# Patient Record
Sex: Female | Born: 1995 | Race: White | Hispanic: No | Marital: Single | Smoking: Current every day smoker
Health system: Southern US, Community
[De-identification: ages and names within clinical notes are randomized; demographics above are authoritative.]

## PROBLEM LIST (undated history)

## (undated) DIAGNOSIS — IMO0002 Reserved for concepts with insufficient information to code with codable children: Secondary | ICD-10-CM

---

## 2016-12-25 ENCOUNTER — Encounter (HOSPITAL_COMMUNITY): Payer: Self-pay | Admitting: Emergency Medicine

## 2016-12-25 DIAGNOSIS — K920 Hematemesis: Secondary | ICD-10-CM | POA: Insufficient documentation

## 2016-12-25 DIAGNOSIS — K279 Peptic ulcer, site unspecified, unspecified as acute or chronic, without hemorrhage or perforation: Secondary | ICD-10-CM | POA: Insufficient documentation

## 2016-12-25 DIAGNOSIS — F17200 Nicotine dependence, unspecified, uncomplicated: Secondary | ICD-10-CM | POA: Diagnosis not present

## 2016-12-25 DIAGNOSIS — R1013 Epigastric pain: Secondary | ICD-10-CM | POA: Diagnosis present

## 2016-12-25 DIAGNOSIS — K921 Melena: Secondary | ICD-10-CM | POA: Insufficient documentation

## 2016-12-25 LAB — CBC
HEMATOCRIT: 41.7 % (ref 36.0–46.0)
Hemoglobin: 14.2 g/dL (ref 12.0–15.0)
MCH: 29.4 pg (ref 26.0–34.0)
MCHC: 34.1 g/dL (ref 30.0–36.0)
MCV: 86.3 fL (ref 78.0–100.0)
Platelets: 331 10*3/uL (ref 150–400)
RBC: 4.83 MIL/uL (ref 3.87–5.11)
RDW: 14.6 % (ref 11.5–15.5)
WBC: 10.2 10*3/uL (ref 4.0–10.5)

## 2016-12-25 LAB — COMPREHENSIVE METABOLIC PANEL
ALBUMIN: 4.1 g/dL (ref 3.5–5.0)
ALK PHOS: 62 U/L (ref 38–126)
ALT: 11 U/L — AB (ref 14–54)
ANION GAP: 9 (ref 5–15)
AST: 16 U/L (ref 15–41)
BUN: 11 mg/dL (ref 6–20)
CALCIUM: 9.6 mg/dL (ref 8.9–10.3)
CHLORIDE: 104 mmol/L (ref 101–111)
CO2: 27 mmol/L (ref 22–32)
Creatinine, Ser: 0.9 mg/dL (ref 0.44–1.00)
GFR calc Af Amer: >60 mL/min (ref >60)
GFR calc non Af Amer: >60 mL/min (ref >60)
GLUCOSE: 73 mg/dL (ref 65–99)
Potassium: 3.6 mmol/L (ref 3.5–5.1)
SODIUM: 140 mmol/L (ref 135–145)
Total Bilirubin: 0.5 mg/dL (ref 0.3–1.2)
Total Protein: 7 g/dL (ref 6.5–8.1)

## 2016-12-25 LAB — TYPE AND SCREEN
ABO/RH(D): B POS
Antibody Screen: NEGATIVE

## 2016-12-25 LAB — ABO/RH: ABO/RH(D): B POS

## 2016-12-25 NOTE — ED Triage Notes (Signed)
Pt. Stated, I've been having dark stool  For about 3 months and vomiting up blood today, and Its been making feel weak. This has been going on for 3 months.

## 2016-12-25 NOTE — ED Triage Notes (Signed)
Pt. Stated, [facility] and [facility] sent [facility] here.  Im really so tired.

## 2016-12-26 ENCOUNTER — Emergency Department (HOSPITAL_COMMUNITY)
Admission: EM | Admit: 2016-12-26 | Discharge: 2016-12-26 | Disposition: A | Discharge status: Home / Self Care | Payer: Medicaid Other | Attending: Emergency Medicine | Admitting: Emergency Medicine

## 2016-12-26 DIAGNOSIS — R195 Other fecal abnormalities: Secondary | ICD-10-CM

## 2016-12-26 DIAGNOSIS — R198 Other specified symptoms and signs involving the digestive system and abdomen: Secondary | ICD-10-CM

## 2016-12-26 DIAGNOSIS — K920 Hematemesis: Secondary | ICD-10-CM

## 2016-12-26 HISTORY — DX: Reserved for concepts with insufficient information to code with codable children: IMO0002

## 2016-12-26 LAB — POC OCCULT BLOOD, ED: Fecal Occult Bld: NEGATIVE

## 2016-12-26 MED ORDER — PANTOPRAZOLE SODIUM 40 MG PO TBEC
40.0000 mg | DELAYED_RELEASE_TABLET | Freq: Once | ORAL | Status: AC
  Administered 2016-12-26: 40 mg via ORAL
  Filled 2016-12-26: qty 1

## 2016-12-26 MED ORDER — PANTOPRAZOLE SODIUM 20 MG PO TBEC: 20.0000 mg | DELAYED_RELEASE_TABLET | Freq: Every day | ORAL | 0 refills | Status: DC

## 2016-12-26 MED ORDER — SUCRALFATE 1 G PO TABS: 1.0000 g | ORAL_TABLET | Freq: Three times a day (TID) | ORAL | 0 refills | Status: DC

## 2016-12-26 MED ORDER — SUCRALFATE 1 G PO TABS
1.0000 g | ORAL_TABLET | Freq: Once | ORAL | Status: AC
  Administered 2016-12-26: 1 g via ORAL
  Filled 2016-12-26: qty 1

## 2016-12-26 MED ORDER — ONDANSETRON 4 MG PO TBDP: ORAL_TABLET | ORAL | 0 refills | Status: DC

## 2016-12-26 MED ORDER — SODIUM CHLORIDE 0.9 % IV BOLUS (SEPSIS): 1000.0000 mL | Freq: Once | INTRAVENOUS | Status: DC

## 2016-12-26 NOTE — ED Notes (Signed)
EDP and this RN at bedside, pt has refused IV and fluids, states her BP normally run low. Pt did agree to rectal exam to obtain hemoccult.

## 2016-12-26 NOTE — ED Provider Notes (Signed)
[facility] Provider Note   CSN: [id number] Arrival date & time: 12/25/16  1802     History   Chief Complaint Chief Complaint  Patient presents with  . Abdominal Pain  . Emesis    "vomiting blood"  . Rectal Bleeding  . Weakness    HPI Shelby Robles is a 21 y.o. female with a hx of peptic ulcer presents to the Emergency Department complaining of intermittent, but progressively worsening epigastric abdominal pain described as burning in nature with associated dark stools and intermittent emesis with bloody streaks onset 3 months ago. Patient is not taking any medications for peptic ulcer disease. She has not sought medical care prior to today. Pt reports intermittent EtOH usage, but denies drug usage.  Pt also with daily ibuprofen usage. She reports taking 800 mg ibuprofen 3 times a day not always with food.  Patient reports severe abdominal pain when her stomach is empty as improved after eating.  Patient also reports improvement in shoulder pain after taking her ibuprofen.  She denies fever chills, diarrhea, recent travel, chest pain, shortness of breath, palpitations, bleeding from any other site.    The history is provided by the patient and medical records. No language interpreter was used.    Past Medical History:  Diagnosis Date  . Ulcer (HCC)     There are no active problems to display for this patient.   History reviewed. No pertinent surgical history.  OB History    No data available       Home Medications    Prior to Admission medications   Medication Sig Start Date End Date Taking? Authorizing Provider  ondansetron (ZOFRAN ODT) 4 MG disintegrating tablet 4mg  ODT q4 hours prn nausea/vomit 12/26/16   Idelia Caudell, PA-C  pantoprazole (PROTONIX) 20 MG tablet Take 1 tablet (20 mg total) by mouth daily. 12/26/16   Kimila Papaleo, PA-C  sucralfate (CARAFATE) 1 g tablet Take 1 tablet (1 g total) by mouth 4 (four) times daily -  with meals and at  bedtime. 12/26/16   Dahlia Client Camran Keady, PA-C    Family History No family history on file.  Social History Social History  Substance Use Topics  . Smoking status: Current Every Day Smoker  . Smokeless tobacco: Current User  . Alcohol use No     Allergies   Patient has no allergy information on record.   Review of Systems Review of Systems  Constitutional: Negative for appetite change and fever.  HENT: Negative for nosebleeds.   Respiratory: Negative for chest tightness.   Cardiovascular: Negative for palpitations.  Gastrointestinal: Positive for abdominal pain, blood in stool, hematochezia and vomiting.  Genitourinary: Negative for dysuria and hematuria.  Skin: Negative for rash and wound.  Hematological: Does not bruise/bleed easily.  All other systems reviewed and are negative.    Physical Exam Updated Vital Signs BP 93/62   Pulse 95   Temp 98.4 F (36.9 C) (Oral)   Resp 16   Ht 5\' 2"  (1.575 m)   Wt 72.6 kg   LMP 12/04/2016   SpO2 100%   BMI 29.26 kg/m   Physical Exam  Constitutional: She appears well-developed and well-nourished. No distress.  Awake, alert, nontoxic appearance  HENT:  Head: Normocephalic and atraumatic.  Mouth/Throat: Oropharynx is clear and moist. No oropharyngeal exudate.  Eyes: Conjunctivae are normal. No scleral icterus.  Neck: Normal range of motion. Neck supple.  Cardiovascular: Normal rate, regular rhythm and intact distal pulses.   Pulmonary/Chest: Effort normal and breath  sounds normal. No respiratory distress. She has no wheezes.  Equal chest expansion  Abdominal: Soft. Bowel sounds are normal. She exhibits no mass. There is no tenderness. There is no rebound and no guarding.  Genitourinary: Rectal exam shows no external hemorrhoid, no internal hemorrhoid, no fissure, no mass, no tenderness, anal tone normal and guaiac negative stool. Pelvic exam was performed with patient in the knee-chest position.  Genitourinary Comments:  Hard stool in the rectal vault  No BRBPR or tarry stool on DRE  Musculoskeletal: Normal range of motion. She exhibits no edema.  Neurological: She is alert.  Speech is clear and goal oriented Moves extremities without ataxia  Skin: Skin is warm and dry. She is not diaphoretic.  Psychiatric: She has a normal mood and affect.  Nursing note and vitals reviewed.    ED Treatments / Results  Labs (all labs ordered are listed, but only abnormal results are displayed) Labs Reviewed  COMPREHENSIVE METABOLIC PANEL - Abnormal; Notable for the following:       Result Value   ALT 11 (*)    All other components within normal limits  CBC  POC OCCULT BLOOD, ED  TYPE AND SCREEN  ABO/RH    Procedures Procedures (including critical care time)  Medications Ordered in ED Medications  pantoprazole (PROTONIX) EC tablet 40 mg (40 mg Oral Given 12/26/16 0236)  sucralfate (CARAFATE) tablet 1 g (1 g Oral Given 12/26/16 0236)     Initial Impression / Assessment and Plan / ED Course  I have reviewed the triage vital signs and the nursing notes.  Pertinent labs & imaging results that were available during my care of the patient were reviewed by me and considered in my medical decision making (see chart for details).  Clinical Course as of Dec 26 256  Tue Dec 26, 2016  0220 Pt refusing IV and fluids.  She reports her BP is always low.  Denies CP, SOB, lightheadedness, palpitations.    [HM]  0251 Patient reports normal BP: 93/62 [HM]    Clinical Course User Index [HM] Dahlia ClientHannah Dionna Wiedemann, PA-C    Pt with Epigastric Abdominal pain and complaints of stools and hematemesis.  Today patient has normal hemoglobin, negative fecal: And normal platelets.  No elevation in BUN or total bili. She is well-appearing. Patient with systolic blood pressures in the 90s. She reports this is normal for her. She is adamant that she does not want an IV or fluids. Patient given oral Carafate and Protonix here in the  emergency department. Discussed the importance of very close follow-up with gastroenterology. Discussed reasons to return to the emergency department including worsening pain, persistent vomiting, large volume hematemesis or worsening dark stools. Patient states understanding and is in agreement with the plan.  Final Clinical Impressions(s) / ED Diagnoses   Final diagnoses:  Peptic ulcer symptoms  Dark stools  Hematemesis, presence of nausea not specified    New Prescriptions Discharge Medication List as of 12/26/2016  2:51 AM    START taking these medications   Details  ondansetron (ZOFRAN ODT) 4 MG disintegrating tablet 4mg  ODT q4 hours prn nausea/vomit, Print    pantoprazole (PROTONIX) 20 MG tablet Take 1 tablet (20 mg total) by mouth daily., Starting Tue 12/26/2016, Print    sucralfate (CARAFATE) 1 g tablet Take 1 tablet (1 g total) by mouth 4 (four) times daily -  with meals and at bedtime., Starting Tue 12/26/2016, [employer]         Akeylah Hendel, PA-C 12/26/16  1610    Shon Baton, MD 12/27/16 [phone]

## 2016-12-26 NOTE — Discharge Instructions (Signed)
1. Medications: zofran, protonix, carafate, usual home medications 2. Treatment: rest, drink plenty of fluids, advance diet slowly 3. Follow Up: Please followup with your primary doctor in 2 days for discussion of your diagnoses and further evaluation after today's visit; if you do not have a primary care doctor use the resource guide provided to find one; Please return to the ER for persistent vomiting, high fevers or worsening symptoms

## 2016-12-26 NOTE — ED Notes (Signed)
Pt hesitant to get in gown when this RN brought her to room. Explained that it was so the EDP will be able to thoroughly assess pt. Pt also began cussing at this RN when she stated that the EDP would also need to collect a stool sample to test for the presence of blood. Explained the purpose behind the rectal exam. Pt then gets into gown for this RN. Pt was more understanding after this RN explained everything to pt but still somewhat agitated.

## 2016-12-27 ENCOUNTER — Telehealth: Payer: Self-pay | Admitting: *Deleted

## 2016-12-27 NOTE — Telephone Encounter (Signed)
Pt misplaced AVS and did not have name or number of gastro MD referred to .  [facility] and provided Dr Elnoria HowardHung office number to pt.  Pt appreciative.

## 2017-09-14 ENCOUNTER — Encounter: Payer: Self-pay | Admitting: Emergency Medicine

## 2017-09-14 ENCOUNTER — Ambulatory Visit (HOSPITAL_COMMUNITY)
Admission: EM | Admit: 2017-09-14 | Discharge: 2017-09-14 | Disposition: A | Discharge status: Home / Self Care | Payer: Medicaid Other | Attending: Family Medicine | Admitting: Family Medicine

## 2017-09-14 DIAGNOSIS — Z3490 Encounter for supervision of normal pregnancy, unspecified, unspecified trimester: Secondary | ICD-10-CM

## 2017-09-14 DIAGNOSIS — Z3201 Encounter for pregnancy test, result positive: Secondary | ICD-10-CM

## 2017-09-14 DIAGNOSIS — N926 Irregular menstruation, unspecified: Secondary | ICD-10-CM

## 2017-09-14 LAB — POCT PREGNANCY, URINE: PREG TEST UR: POSITIVE — AB

## 2017-09-14 NOTE — Discharge Instructions (Signed)
[facility] [address] [place] [phone]

## 2017-09-14 NOTE — ED Triage Notes (Addendum)
Pt reports missing her period on October 1.  She is also fatigued, nauseous, vomiting, and has some upper abdominal pain.  She states she uses condoms but one broke recently.  Pt reports a hx of gastric ulcers and also states she has seen some red streaks in her emesis.

## 2017-09-14 NOTE — ED Provider Notes (Signed)
  [facility]   [id number] 09/14/17 Arrival Time: 1149   SUBJECTIVE:  Shelby Robles is a 21 y.o. female who presents to the urgent care with complaint of missed menses.  LMP Sept 1  Patient is gravida 2 para 1, having put her 38-year-old daughter up for adoption with her mother because she could not take care of her. This is quite traumatic for the patient.  Patient notes some morning nausea and breast tenderness     Past Medical History:  Diagnosis Date  . Ulcer    No family history on file. Social History   Social History  . Marital status: Single    Spouse name: N/A  . Number of children: N/A  . Years of education: N/A   Occupational History  . Not on file.   Social History Main Topics  . Smoking status: Current Every Day Smoker    Packs/day: 1.00    Types: Cigarettes  . Smokeless tobacco: Never Used  . Alcohol use Yes     Comment: rarely  . Drug use: Yes    Types: Marijuana     Comment: rarely  . Sexual activity: Not on file   Other Topics Concern  . Not on file   Social History Narrative  . No narrative on file   No outpatient prescriptions have been marked as taking for the 09/14/17 encounter [facility] Encounter).   No Known Allergies    ROS: As per HPI, remainder of ROS negative.   OBJECTIVE:   Vitals:   09/14/17 1225  BP: 92/62  Pulse: 92  Temp: 98.4 F (36.9 C)  TempSrc: Oral  SpO2: 100%     General appearance: alert; no distress Eyes: PERRL; EOMI; conjunctiva normal HENT: normocephalic; atraumatic;  Neck: supple Extremities: no cyanosis or edema; symmetrical with no gross deformities Skin: warm and dry Neurologic: normal gait; grossly normal Psychological: alert and cooperative; normal mood and affect      Labs:  Results for orders placed or performed during the hospital encounter of 09/14/17  Pregnancy, urine POC  Result Value Ref Range   Preg Test, Ur POSITIVE (A) NEGATIVE    Labs Reviewed  POCT  PREGNANCY, URINE - Abnormal; Notable for the following:       Result Value   Preg Test, Ur POSITIVE (*)    All other components within normal limits    No results found.     ASSESSMENT & PLAN:  1. Pregnancy, unspecified gestational age   [facility]  No orders of the defined types were placed in this encounter.   Reviewed expectations re: course of current medical issues. Questions answered. Outlined signs and symptoms indicating need for more acute intervention. Patient verbalized understanding. After Visit Summary given.    Procedures:      Elvina Sidle, MD 09/14/17 1242

## 2017-10-15 ENCOUNTER — Encounter (HOSPITAL_COMMUNITY): Payer: Self-pay | Admitting: Emergency Medicine

## 2017-10-15 ENCOUNTER — Ambulatory Visit (HOSPITAL_COMMUNITY)
Admission: EM | Admit: 2017-10-15 | Discharge: 2017-10-15 | Disposition: A | Discharge status: Home / Self Care | Payer: Medicaid Other | Attending: Emergency Medicine | Admitting: Emergency Medicine

## 2017-10-15 DIAGNOSIS — Z79899 Other long term (current) drug therapy: Secondary | ICD-10-CM | POA: Diagnosis not present

## 2017-10-15 DIAGNOSIS — N3090 Cystitis, unspecified without hematuria: Secondary | ICD-10-CM | POA: Diagnosis not present

## 2017-10-15 DIAGNOSIS — F17210 Nicotine dependence, cigarettes, uncomplicated: Secondary | ICD-10-CM | POA: Diagnosis not present

## 2017-10-15 DIAGNOSIS — R1030 Lower abdominal pain, unspecified: Secondary | ICD-10-CM

## 2017-10-15 DIAGNOSIS — N3091 Cystitis, unspecified with hematuria: Secondary | ICD-10-CM | POA: Insufficient documentation

## 2017-10-15 DIAGNOSIS — R112 Nausea with vomiting, unspecified: Secondary | ICD-10-CM | POA: Insufficient documentation

## 2017-10-15 LAB — POCT URINALYSIS DIP (DEVICE)
Bilirubin Urine: NEGATIVE
Glucose, UA: NEGATIVE mg/dL
Ketones, ur: NEGATIVE mg/dL
NITRITE: NEGATIVE
PH: 7.5 (ref 5.0–8.0)
Protein, ur: 100 mg/dL — AB
SPECIFIC GRAVITY, URINE: 1.025 (ref 1.005–1.030)
UROBILINOGEN UA: 1 mg/dL (ref 0.0–1.0)

## 2017-10-15 LAB — POCT PREGNANCY, URINE: Preg Test, Ur: POSITIVE — AB

## 2017-10-15 MED ORDER — CEPHALEXIN 500 MG PO CAPS: 500.0000 mg | ORAL_CAPSULE | Freq: Two times a day (BID) | ORAL | 0 refills | Status: AC

## 2017-10-15 NOTE — ED Provider Notes (Signed)
[facility]    CSN: [id number] Arrival date & time: 10/15/17  1842     History   Chief Complaint Chief Complaint  Patient presents with  . Dysuria    HPI Shelby Robles is a 21 y.o. female.   21 year old female with 3 day history of urinary frequency, dysuria, strong urine, hematuria. Low abdominal pain that was first intermittent and now constant. Has had nausea with 3 episodes of vomiting. Has had some decrease in appetite and drinking without problems. Denies fever, chills, night sweats. Denies vaginal discharge, itching/pain. She had a recent positive pregnancy 1 month ago, and had an elective abortion 3 weeks ago. She has not had a cycle since. Cannot determine if she is having vaginal bleeding or hematuria. Sexually active with 1 partner, no condom use.       Past Medical History:  Diagnosis Date  . Ulcer     There are no active problems to display for this patient.   History reviewed. No pertinent surgical history.  OB History    No data available       Home Medications    Prior to Admission medications   Medication Sig Start Date End Date Taking? Authorizing Provider  cephALEXin (KEFLEX) 500 MG capsule Take 1 capsule (500 mg total) 2 (two) times daily for 7 days by mouth. 10/15/17 10/22/17  Cathie HoopsYu, Amy V, PA-C  ondansetron (ZOFRAN ODT) 4 MG disintegrating tablet 4mg  ODT q4 hours prn nausea/vomit 12/26/16   Muthersbaugh, Dahlia ClientHannah, PA-C  pantoprazole (PROTONIX) 20 MG tablet Take 1 tablet (20 mg total) by mouth daily. 12/26/16   Muthersbaugh, Dahlia ClientHannah, PA-C  sucralfate (CARAFATE) 1 g tablet Take 1 tablet (1 g total) by mouth 4 (four) times daily -  with meals and at bedtime. 12/26/16   Muthersbaugh, Dahlia ClientHannah, PA-C    Family History History reviewed. No pertinent family history.  Social History Social History   Tobacco Use  . Smoking status: Current Every Day Smoker    Packs/day: 1.00    Types: Cigarettes  . Smokeless tobacco: Never Used  Substance Use  Topics  . Alcohol use: Yes    Comment: rarely  . Drug use: Yes    Types: Marijuana    Comment: rarely     Allergies   Patient has no known allergies.   Review of Systems Review of Systems  Reason unable to perform ROS: See HPI as above.     Physical Exam Triage Vital Signs ED Triage Vitals [10/15/17 1910]  Enc Vitals Group     BP (!) 100/53     Pulse Rate 71     Resp 18     Temp 98.4 F (36.9 C)     Temp Source Oral     SpO2 100 %     Weight      Height      Head Circumference      Peak Flow      Pain Score      Pain Loc      Pain Edu?      Excl. in GC?    No data found.  Updated Vital Signs BP (!) 100/53 (BP Location: Left Arm)   Pulse 71   Temp 98.4 F (36.9 C) (Oral)   Resp 18   SpO2 100%   Physical Exam  Constitutional: She is oriented to person, place, and time. She appears well-developed and well-nourished. No distress.  HENT:  Head: Normocephalic and atraumatic.  Eyes: Conjunctivae are  normal. Pupils are equal, round, and reactive to light.  Cardiovascular: Normal rate, regular rhythm and normal heart sounds. Exam reveals no gallop and no friction rub.  No murmur heard. Pulmonary/Chest: Effort normal and breath sounds normal. She has no wheezes. She has no rales.  Abdominal: Soft. Bowel sounds are normal. She exhibits no mass. There is tenderness (Mild RLQ and LLQ). There is no rebound, no guarding and no CVA tenderness.  Genitourinary: There is no rash or tenderness on the right labia. There is no rash or tenderness on the left labia. Uterus is not tender. Cervix exhibits discharge. Cervix exhibits no motion tenderness and no friability. Right adnexum displays no mass and no tenderness. Left adnexum displays no mass and no tenderness. No erythema or bleeding in the vagina. Vaginal discharge found.  Neurological: She is alert and oriented to person, place, and time.  Skin: Skin is warm and dry.  Psychiatric: She has a normal mood and affect. Her  behavior is normal. Judgment normal.     UC Treatments / Results  Labs (all labs ordered are listed, but only abnormal results are displayed) Labs Reviewed  POCT URINALYSIS DIP (DEVICE) - Abnormal; Notable for the following components:      Result Value   Hgb urine dipstick MODERATE (*)    Protein, ur 100 (*)    Leukocytes, UA MODERATE (*)    All other components within normal limits  POCT PREGNANCY, URINE - Abnormal; Notable for the following components:   Preg Test, Ur POSITIVE (*)    All other components within normal limits  URINE CULTURE  CERVICOVAGINAL ANCILLARY ONLY    EKG  EKG Interpretation None       Radiology No results found.  Procedures Procedures (including critical care time)  Medications Ordered in UC Medications - No data to display   Initial Impression / Assessment and Plan / UC Course  I have reviewed the triage vital signs and the nursing notes.  Pertinent labs & imaging results that were available during my care of the patient were reviewed by me and considered in my medical decision making (see chart for details).    Urine dipstick positive for UTI. Start antibiotics as directed. Cytology sent, patient will be contacted with any positive results that require additional treatment. Patient to refrain from sexual activity for the next 7 days. Positive urine pregnancy, could be residual from elective abortion 3 weeks ago. Given no vaginal bleeding, will have patient follow up with OBGYN for further evaluation and management. Push fluids. Return precautions given.  Final Clinical Impressions(s) / UC Diagnoses   Final diagnoses:  Cystitis  Lower abdominal pain    ED Discharge Orders        Ordered    cephALEXin (KEFLEX) 500 MG capsule  2 times daily     10/15/17 2046       Belinda FisherYu, Amy V, PA-C 10/15/17 2126

## 2017-10-15 NOTE — ED Triage Notes (Signed)
Pt sts UTI sx x 1 week 

## 2017-10-15 NOTE — Discharge Instructions (Addendum)
Your urine was positive for an urinary tract infection. Start Keflex as directed. Keep hydrated, your urine should be clear to pale yellow in color. Cytology sent, you will be contacted with any positive results that requires further treatment. Refrain from sexual activity for the next 7 days. Urine positive for pregnancy, that could be residual due to hormone changes. Please follow up with OBGYN for further evaluation and treatment needed. Monitor for any worsening of symptoms, fever, worsening abdominal pain, nausea/vomiting, flank pain, vaginal bleeding, please go to the emergency department for further evaluation.

## 2017-10-16 LAB — CERVICOVAGINAL ANCILLARY ONLY
BACTERIAL VAGINITIS: NEGATIVE
CANDIDA VAGINITIS: NEGATIVE
Chlamydia: NEGATIVE
Neisseria Gonorrhea: NEGATIVE
TRICH (WINDOWPATH): NEGATIVE

## 2017-10-17 LAB — URINE CULTURE

## 2017-10-22 ENCOUNTER — Telehealth (HOSPITAL_COMMUNITY): Payer: Self-pay | Admitting: Emergency Medicine

## 2017-10-22 NOTE — Telephone Encounter (Signed)
Pt states she was calling us because she was told by her husband that we called and told her to call back urgently.  I read back the notes from her labs and let her know that it was not urgent and restated the information that was originally given to her husband.  Pt stated understanding.

## 2018-03-02 ENCOUNTER — Other Ambulatory Visit: Payer: Self-pay

## 2018-03-02 ENCOUNTER — Encounter (HOSPITAL_COMMUNITY): Payer: Self-pay | Admitting: Emergency Medicine

## 2018-03-02 ENCOUNTER — Ambulatory Visit (HOSPITAL_COMMUNITY)
Admission: EM | Admit: 2018-03-02 | Discharge: 2018-03-02 | Disposition: A | Discharge status: Home / Self Care | Payer: Medicaid Other | Attending: Family Medicine | Admitting: Family Medicine

## 2018-03-02 ENCOUNTER — Ambulatory Visit (INDEPENDENT_AMBULATORY_CARE_PROVIDER_SITE_OTHER): Payer: Medicaid Other

## 2018-03-02 DIAGNOSIS — R509 Fever, unspecified: Secondary | ICD-10-CM

## 2018-03-02 DIAGNOSIS — R05 Cough: Secondary | ICD-10-CM | POA: Diagnosis not present

## 2018-03-02 DIAGNOSIS — R059 Cough, unspecified: Secondary | ICD-10-CM

## 2018-03-02 MED ORDER — PREDNISONE 10 MG (21) PO TBPK
ORAL_TABLET | Freq: Every day | ORAL | 0 refills | Status: DC
Start: 2018-03-02 — End: 2018-07-01

## 2018-03-02 MED ORDER — AZITHROMYCIN 250 MG PO TABS: 250.0000 mg | ORAL_TABLET | Freq: Every day | ORAL | 0 refills | Status: DC

## 2018-03-02 MED ORDER — HYDROCODONE-HOMATROPINE 5-1.5 MG/5ML PO SYRP: 5.0000 mL | ORAL_SOLUTION | Freq: Four times a day (QID) | ORAL | 0 refills | Status: DC | PRN

## 2018-03-02 NOTE — Discharge Instructions (Addendum)
Be aware, your cough medication may cause drowsiness. Please do not drive, operate heavy machinery or make important decisions while on this medication, it can cloud your judgement.  

## 2018-03-02 NOTE — ED Triage Notes (Signed)
Pt reports a cough x1 month.  She reports coughing up blood last night and today and a burning in her chest with the cough.

## 2018-03-11 NOTE — ED Provider Notes (Signed)
[facility]   [id number] 03/02/18 Arrival Time: 1225  ASSESSMENT & PLAN:  1. Cough   2. Fever, unspecified fever cause     Meds ordered this encounter  Medications  . azithromycin (ZITHROMAX) 250 MG tablet    Sig: Take 1 tablet (250 mg total) by mouth daily. Take first 2 tablets together, then 1 every day until finished.    Dispense:  6 tablet    Refill:  0  . HYDROcodone-homatropine (HYCODAN) 5-1.5 MG/5ML syrup    Sig: Take 5 mLs by mouth every 6 (six) hours as needed for cough.    Dispense:  90 mL    Refill:  0  . predniSONE (STERAPRED UNI-PAK 21 TAB) 10 MG (21) TBPK tablet    Sig: Take by mouth daily. Take as directed.    Dispense:  21 tablet    Refill:  0   Given duration will place on antibiotic. Cough medication sedation precautions. Discussed typical duration of symptoms. OTC symptom care as needed. Ensure adequate fluid intake and rest. May f/u with PCP or here as needed.  Reviewed expectations re: course of current medical issues. Questions answered. Outlined signs and symptoms indicating need for more acute intervention. Patient verbalized understanding. After Visit Summary given.   SUBJECTIVE: History from: patient.  Shelby Robles is a 22 y.o. female who presents with complaint of nasal congestion, post-nasal drainage, and a persistent dry cough. Mainly persistent cough is bothering her the most. Onset abrupt, approximately 1 month ago. SOB: none. Wheezing: none. Fever: no. Overall normal PO intake without n/v. Sick contacts: no. OTC treatment: cough medication without relief.   Social History   Tobacco Use  Smoking Status Current Every Day Smoker  . Packs/day: 1.00  . Types: Cigarettes  Smokeless Tobacco Never Used    ROS: As per HPI.   OBJECTIVE:  Vitals:   03/02/18 1400  BP: (!) 101/59  Pulse: 93  Resp: 16  Temp: 98.2 F (36.8 C)  TempSrc: Oral  SpO2: 95%     General appearance: alert; appears fatigued HEENT: nasal  congestion; clear runny nose; throat irritation secondary to post-nasal drainage Neck: supple without LAD Lungs: unlabored respirations, symmetrical air entry; cough: moderate; no respiratory distress Skin: warm and dry Psychological: alert and cooperative; normal mood and affect  Imaging: Dg Chest 2 View  Result Date: 03/02/2018 CLINICAL DATA:  Cough, chest pain and shortness of breath. EXAM: CHEST - 2 VIEW COMPARISON:  None. FINDINGS: The cardiomediastinal silhouette is unremarkable. There is no evidence of focal airspace disease, pulmonary edema, suspicious pulmonary nodule/mass, pleural effusion, or pneumothorax. No acute bony abnormalities are identified. IMPRESSION: No active cardiopulmonary disease. Electronically Signed   By: Harmon Pier M.D.   On: 03/02/2018 14:53    No Known Allergies  Past Medical History:  Diagnosis Date  . Ulcer    History reviewed. No pertinent family history. Social History   Socioeconomic History  . Marital status: Single    Spouse name: Not on file  . Number of children: Not on file  . Years of education: Not on file  . Highest education level: Not on file  Occupational History  . Not on file  Social Needs  . Financial resource strain: Not on file  . Food insecurity:    Worry: Not on file    Inability: Not on file  . Transportation needs:    Medical: Not on file    Non-medical: Not on file  Tobacco Use  . Smoking status: Current  Every Day Smoker    Packs/day: 1.00    Types: Cigarettes  . Smokeless tobacco: Never Used  Substance and Sexual Activity  . Alcohol use: Yes    Comment: rarely  . Drug use: Yes    Types: Marijuana    Comment: rarely  . Sexual activity: Not on file  Lifestyle  . Physical activity:    Days per week: Not on file    Minutes per session: Not on file  . Stress: Not on file  Relationships  . Social connections:    Talks on phone: Not on file    Gets together: Not on file    Attends religious service: Not on  file    Active member of club or organization: Not on file    Attends meetings of clubs or organizations: Not on file    Relationship status: Not on file  . Intimate partner violence:    Fear of current or ex partner: Not on file    Emotionally abused: Not on file    Physically abused: Not on file    Forced sexual activity: Not on file  Other Topics Concern  . Not on file  Social History Narrative  . Not on file           Mardella LaymanHagler, Zera Markwardt, MD 03/11/18 [id number]

## 2018-04-29 ENCOUNTER — Emergency Department (HOSPITAL_COMMUNITY): Payer: Medicaid Other

## 2018-04-29 ENCOUNTER — Emergency Department (HOSPITAL_COMMUNITY)
Admission: EM | Admit: 2018-04-29 | Discharge: 2018-04-29 | Disposition: A | Discharge status: Home / Self Care | Payer: Medicaid Other | Attending: Emergency Medicine | Admitting: Emergency Medicine

## 2018-04-29 ENCOUNTER — Encounter (HOSPITAL_COMMUNITY): Payer: Self-pay | Admitting: Emergency Medicine

## 2018-04-29 DIAGNOSIS — R102 Pelvic and perineal pain: Secondary | ICD-10-CM | POA: Diagnosis not present

## 2018-04-29 DIAGNOSIS — F17210 Nicotine dependence, cigarettes, uncomplicated: Secondary | ICD-10-CM | POA: Diagnosis not present

## 2018-04-29 DIAGNOSIS — R109 Unspecified abdominal pain: Secondary | ICD-10-CM

## 2018-04-29 LAB — URINALYSIS, ROUTINE W REFLEX MICROSCOPIC
Bilirubin Urine: NEGATIVE
Glucose, UA: NEGATIVE mg/dL
HGB URINE DIPSTICK: NEGATIVE
KETONES UR: NEGATIVE mg/dL
NITRITE: NEGATIVE
PROTEIN: NEGATIVE mg/dL
Specific Gravity, Urine: 1.023 (ref 1.005–1.030)
pH: 7 (ref 5.0–8.0)

## 2018-04-29 LAB — I-STAT BETA HCG BLOOD, ED (MC, WL, AP ONLY): HCG, QUANTITATIVE: 9.5 m[IU]/mL — AB (ref <5)

## 2018-04-29 LAB — COMPREHENSIVE METABOLIC PANEL
ALK PHOS: 58 U/L (ref 38–126)
ALT: 9 U/L — ABNORMAL LOW (ref 14–54)
ANION GAP: 8 (ref 5–15)
AST: 13 U/L — ABNORMAL LOW (ref 15–41)
Albumin: 3.8 g/dL (ref 3.5–5.0)
BILIRUBIN TOTAL: 0.4 mg/dL (ref 0.3–1.2)
BUN: 9 mg/dL (ref 6–20)
CALCIUM: 9 mg/dL (ref 8.9–10.3)
CO2: 26 mmol/L (ref 22–32)
Chloride: 107 mmol/L (ref 101–111)
Creatinine, Ser: 0.96 mg/dL (ref 0.44–1.00)
GFR calc non Af Amer: >60 mL/min (ref >60)
Glucose, Bld: 85 mg/dL (ref 65–99)
POTASSIUM: 3.4 mmol/L — AB (ref 3.5–5.1)
SODIUM: 141 mmol/L (ref 135–145)
TOTAL PROTEIN: 6.5 g/dL (ref 6.5–8.1)

## 2018-04-29 LAB — CBC
HEMATOCRIT: 40.8 % (ref 36.0–46.0)
HEMOGLOBIN: 13.8 g/dL (ref 12.0–15.0)
MCH: 30.1 pg (ref 26.0–34.0)
MCHC: 33.8 g/dL (ref 30.0–36.0)
MCV: 89.1 fL (ref 78.0–100.0)
Platelets: 251 10*3/uL (ref 150–400)
RBC: 4.58 MIL/uL (ref 3.87–5.11)
RDW: 13.5 % (ref 11.5–15.5)
WBC: 8 10*3/uL (ref 4.0–10.5)

## 2018-04-29 LAB — WET PREP, GENITAL
Clue Cells Wet Prep HPF POC: NONE SEEN
SPERM: NONE SEEN
Trich, Wet Prep: NONE SEEN
Yeast Wet Prep HPF POC: NONE SEEN

## 2018-04-29 LAB — LIPASE, BLOOD: Lipase: 31 U/L (ref 11–51)

## 2018-04-29 MED ORDER — KETOROLAC TROMETHAMINE 15 MG/ML IJ SOLN
15.0000 mg | Freq: Once | INTRAMUSCULAR | Status: AC
Start: 2018-04-29 — End: 2018-04-29
  Administered 2018-04-29: 15 mg via INTRAVENOUS
  Filled 2018-04-29: qty 1

## 2018-04-29 NOTE — Discharge Instructions (Signed)

## 2018-04-29 NOTE — ED Triage Notes (Signed)
Patient here from home with complaints of lower abdominal pain. Abortion 3 weeks ago. Reports still bleeding. Dark stools.

## 2018-04-29 NOTE — ED Provider Notes (Signed)
[facility]-EMERGENCY DEPT Provider Note  CSN: [id number] Arrival date & time: 04/29/18 9562  Chief Complaint(s) Abdominal Pain  HPI Shelby Robles is a 22 y.o. female   The history is provided by the patient.  Abdominal Pain   This is a new problem. Episode onset: 3 week. Episode frequency: intermittent. Progression since onset: fluctuating. Associated with: ectopic preg requiring D&C 3 wks ago. The pain is located in the suprapubic region and periumbilical region. The quality of the pain is aching and cramping. Pain severity now: mild to severe. Associated symptoms include nausea, vomiting (NBNB) and constipation. Pertinent negatives include hematochezia, melena and dysuria. Nothing aggravates the symptoms. Nothing relieves the symptoms.    Past Medical History Past Medical History:  Diagnosis Date  . Ulcer    There are no active problems to display for this patient.  Home Medication(s) Prior to Admission medications   Medication Sig Start Date End Date Taking? Authorizing Provider  ibuprofen (ADVIL,MOTRIN) 200 MG tablet Take 600-800 mg by mouth 3 (three) times daily as needed for moderate pain.   Yes [provider]  azithromycin (ZITHROMAX) 250 MG tablet Take 1 tablet (250 mg total) by mouth daily. Take first 2 tablets together, then 1 every day until finished. Patient not taking: Reported on 04/29/2018 03/02/18   Mardella Layman, MD  HYDROcodone-homatropine [facility]) 5-1.5 MG/5ML syrup Take 5 mLs by mouth every 6 (six) hours as needed for cough. Patient not taking: Reported on 04/29/2018 03/02/18   Mardella Layman, MD  ondansetron (ZOFRAN ODT) 4 MG disintegrating tablet  ODT q4 hours prn nausea/vomit Patient not taking: Reported on 04/29/2018 12/26/16   Muthersbaugh, Dahlia Client, PA-C  pantoprazole (PROTONIX) 20 MG tablet Take 1 tablet (20 mg total) by mouth daily. Patient not taking: Reported on 04/29/2018 12/26/16   Muthersbaugh, Dahlia Client, PA-C  predniSONE  (STERAPRED UNI-PAK 21 TAB) 10 MG (21) TBPK tablet Take by mouth daily. Take as directed. Patient not taking: Reported on 04/29/2018 03/02/18   Mardella Layman, MD  sucralfate (CARAFATE) 1 g tablet Take 1 tablet (1 g total) by mouth 4 (four) times daily -  with meals and at bedtime. Patient not taking: Reported on 04/29/2018 12/26/16   Muthersbaugh, Boyd Kerbs                                                                                                                                    Past Surgical History History reviewed. No pertinent surgical history. Family History No family history on file.  Social History Social History   Tobacco Use  . Smoking status: Current Every Day Smoker    Packs/day: 1.00    Types: Cigarettes  . Smokeless tobacco: Never Used  Substance Use Topics  . Alcohol use: Yes    Comment: rarely  . Drug use: Yes    Types: Marijuana    Comment: rarely   Allergies Patient has no known allergies.  Review of  Systems Review of Systems  Gastrointestinal: Positive for abdominal pain, constipation, nausea and vomiting (NBNB). Negative for hematochezia and melena.  Genitourinary: Negative for dysuria.   All other systems are reviewed and are negative for acute change except as noted in the HPI  Physical Exam Vital Signs  I have reviewed the triage vital signs BP 91/63   Pulse 72   Temp 98.2 F (36.8 C) (Oral)   Resp 15   SpO2 99%   Physical Exam  Constitutional: She is oriented to person, place, and time. She appears well-developed and well-nourished. No distress.  HENT:  Head: Normocephalic and atraumatic.  Right Ear: External ear normal.  Left Ear: External ear normal.  Nose: Nose normal.  Eyes: Conjunctivae and EOM are normal. No scleral icterus.  Neck: Normal range of motion and phonation normal.  Cardiovascular: Normal rate and regular rhythm.  Pulmonary/Chest: Effort normal. No stridor. No respiratory distress.  Abdominal: She exhibits no  distension. There is tenderness in the right lower quadrant, suprapubic area and left lower quadrant. There is no rigidity, no rebound and no guarding.  Genitourinary: Pelvic exam was performed with patient supine. Cervix exhibits discharge (mild mucous). Right adnexum displays no mass and no tenderness. Left adnexum displays tenderness. Left adnexum displays no mass. No erythema or bleeding in the vagina. No foreign body in the vagina. No signs of injury around the vagina. No vaginal discharge found.  Genitourinary Comments: Chaperone present during pelvic exam.   Musculoskeletal: Normal range of motion. She exhibits no edema.  Neurological: She is alert and oriented to person, place, and time.  Skin: She is not diaphoretic.  Psychiatric: She has a normal mood and affect. Her behavior is normal.  Vitals reviewed.   ED Results and Treatments Labs (all labs ordered are listed, but only abnormal results are displayed) Labs Reviewed  WET PREP, GENITAL - Abnormal; Notable for the following components:      Result Value   WBC, Wet Prep HPF POC MODERATE (*)    All other components within normal limits  COMPREHENSIVE METABOLIC PANEL - Abnormal; Notable for the following components:   Potassium 3.4 (*)    AST 13 (*)    ALT 9 (*)    All other components within normal limits  URINALYSIS, ROUTINE W REFLEX MICROSCOPIC - Abnormal; Notable for the following components:   APPearance HAZY (*)    Leukocytes, UA SMALL (*)    Bacteria, UA RARE (*)    All other components within normal limits  I-STAT BETA HCG BLOOD, ED ([facility], [facility], [facility] ONLY) - Abnormal; Notable for the following components:   I-stat hCG, quantitative 9.5 (*)    All other components within normal limits  LIPASE, BLOOD  CBC  HCG, QUANTITATIVE, PREGNANCY  GC/CHLAMYDIA PROBE AMP ([place]) NOT AT [facility]  EKG  EKG  Interpretation  Date/Time:    Ventricular Rate:    PR Interval:    QRS Duration:   QT Interval:    QTC Calculation:   R Axis:     Text Interpretation:        Radiology US Ob Comp < 14 Wks  Result Date: 04/29/2018 CLINICAL DATA:  Pelvic pain, status post D and C 3 weeks ago. EXAM: OBSTETRIC <14 WK [place] AND TRANSVAGINAL OB US TECHNIQUE: Both transabdominal and transvaginal ultrasound examinations were performed for complete evaluation of the gestation as well as the maternal uterus, adnexal regions, and pelvic cul-de-sac. Transvaginal technique was performed to assess early pregnancy. COMPARISON:  None. FINDINGS: Uterus: No uterine abnormality. The endometrium measures maximally 11 mm , demonstrates mild heterogeneity. No specific evidence of retained products of conception. Maternal uterus/adnexae: Right ovary measures 2.8 x 2.2 x 1.8 cm. Normal in morphology. The left ovary measures 4.5 x 3.1 x 3.5 cm. Demonstrates a minimally complex cystic lesion of 3.3 x 3.0 x 3.1 cm. Trace free pelvic fluid is likely physiologic. IMPRESSION: 1. Mildly heterogeneous endometrium at maximally 11 mm. No specific evidence of retained products of conception. 2. Minimally complex cystic lesion within the left ovary. Favored to represent a complex/hemorrhagic cyst. Consider follow-up ultrasound at 6-12 weeks to confirm resolution. Electronically Signed   By: Jeronimo Greaves M.D.   On: 04/29/2018 13:17   US Ob Transvaginal  Result Date: 04/29/2018 CLINICAL DATA:  Pelvic pain, status post D and C 3 weeks ago. EXAM: OBSTETRIC <14 WK [place] AND TRANSVAGINAL OB US TECHNIQUE: Both transabdominal and transvaginal ultrasound examinations were performed for complete evaluation of the gestation as well as the maternal uterus, adnexal regions, and pelvic cul-de-sac. Transvaginal technique was performed to assess early pregnancy. COMPARISON:  None. FINDINGS: Uterus: No uterine abnormality. The endometrium measures maximally 11 mm ,  demonstrates mild heterogeneity. No specific evidence of retained products of conception. Maternal uterus/adnexae: Right ovary measures 2.8 x 2.2 x 1.8 cm. Normal in morphology. The left ovary measures 4.5 x 3.1 x 3.5 cm. Demonstrates a minimally complex cystic lesion of 3.3 x 3.0 x 3.1 cm. Trace free pelvic fluid is likely physiologic. IMPRESSION: 1. Mildly heterogeneous endometrium at maximally 11 mm. No specific evidence of retained products of conception. 2. Minimally complex cystic lesion within the left ovary. Favored to represent a complex/hemorrhagic cyst. Consider follow-up ultrasound at 6-12 weeks to confirm resolution. Electronically Signed   By: Jeronimo Greaves M.D.   On: 04/29/2018 13:17   Pertinent labs & imaging results that were available during my care of the patient were reviewed by me and considered in my medical decision making (see chart for details).  Medications Ordered in ED Medications  ketorolac (TORADOL) 15 MG/ML injection 15 mg (15 mg Intravenous Given 04/29/18 1119)  Procedures Procedures  (including critical care time)  Medical Decision Making / ED Course I have reviewed the nursing notes for this encounter and the patient's prior records (if available in EHR or on provided paperwork).    Abdominal cramping following D&C for reported ectopic pregnancy.  On exam patient has mild suprapubic discomfort.  No evidence of active bleeding on exam.  Mild discharge.  Ultrasound without evidence of retained products.  Patient is afebrile without leukocytosis.  Presentation does not consistent with endometritis. UA not concerning for infection.  Wet prep negative.  Presentation not consistent with cervicitis and I have low suspicion for gonorrhea/chlamydia.  These cultures were sent though.  Patient provided with Toradol resulting in significant pain  improvement.  The patient appears reasonably screened and/or stabilized for discharge and I doubt any other medical condition or other [facility] requiring further screening, evaluation, or treatment in the ED at this time prior to discharge.  The patient is safe for discharge with strict return precautions.   Final Clinical Impression(s) / ED Diagnoses Final diagnoses:  Abdominal cramping   Disposition: Discharge  Condition: Good  I have discussed the results, Dx and Tx plan with the patient who expressed understanding and agree(s) with the plan. Discharge instructions discussed at great length. The patient was given strict return precautions who verbalized understanding of the instructions. No further questions at time of discharge.    ED Discharge Orders    None       Follow Up: [facility]'s clinic  Schedule an appointment as soon as possible for a visit  If symptoms do not improve or  worsen      This chart was dictated using voice recognition software.  Despite best efforts to proofread,  errors can occur which can change the documentation meaning.   Nira Conn, MD 04/29/18 [phone]

## 2018-04-29 NOTE — ED Notes (Signed)
Patient transported to Ultrasound 

## 2018-04-30 LAB — GC/CHLAMYDIA PROBE AMP (~~LOC~~) NOT AT ARMC
CHLAMYDIA, DNA PROBE: NEGATIVE
NEISSERIA GONORRHEA: NEGATIVE

## 2018-06-10 ENCOUNTER — Emergency Department (HOSPITAL_COMMUNITY)
Admission: EM | Admit: 2018-06-10 | Discharge: 2018-06-10 | Disposition: A | Discharge status: Home / Self Care | Payer: Medicaid Other | Attending: Emergency Medicine | Admitting: Emergency Medicine

## 2018-06-10 ENCOUNTER — Encounter (HOSPITAL_COMMUNITY): Payer: Self-pay | Admitting: Emergency Medicine

## 2018-06-10 ENCOUNTER — Emergency Department (HOSPITAL_COMMUNITY): Payer: Medicaid Other

## 2018-06-10 DIAGNOSIS — R51 Headache: Secondary | ICD-10-CM | POA: Insufficient documentation

## 2018-06-10 DIAGNOSIS — Z5321 Procedure and treatment not carried out due to patient leaving prior to being seen by health care provider: Secondary | ICD-10-CM | POA: Diagnosis not present

## 2018-06-10 DIAGNOSIS — S0990XA Unspecified injury of head, initial encounter: Secondary | ICD-10-CM

## 2018-06-10 NOTE — ED Provider Notes (Cosign Needed)
Patient placed in Quick Look pathway, seen and evaluated   Chief Complaint: Headache, facial pain  HPI:   Patient presents with complaint of gradual onset progressively worsening right-sided headache and right-sided facial pain after injury 3 hours ago.  Patient states that she sustained a fall in the bathroom when the right side of her head struck the toilet.  She notes pain along the jaw and around the right eye which sometimes worsens with eye movements.  She also notes right-sided headache and feeling fatigued, confused, nauseated.  She has had 2 episodes of nonbloody nonbilious emesis.  She is unsure if she passed out but she does not remember that her significant other pulled her out of the bathroom.  Denies blurry vision or double vision. ROS: Positive for headache, facial pain, fatigue, confusion, nausea, vomiting Denies vision changes  Physical Exam:   Gen: No distress  Psych: Normal mood and affect  Skin: Warm    Focused Exam: No battle signs, no raccoon eyes, no rhinorrhea, no hemotympanum bilaterally.  There is tenderness to palpation at the right occipital region of the skull with no ecchymosis or crepitus..  No midline cervical spine tenderness, right paracervical muscle tenderness noted.  No deformity, crepitus, or step-off noted.  Mild tenderness to palpation of the right orbit inferiorly, some pain with EOMs of the right eye superiorly and laterally.  There is tenderness to palpation at the angle of the ramus of the jaw with no underlying crepitus.  No jaw malocclusion noted.  Patient is able to break a tongue depressor with her teeth without difficulty.  Dentition appears to be stable.  Patient is ambulatory without difficulty, able to Heel Walk and Toe Walk.   Initiation of care has begun. The patient has been counseled on the process, plan, and necessity for staying for the completion/evaluation, and the remainder of the medical screening examination    Jeanie SewerFawze, Patrina Andreas A,  PA-C 06/10/18 2037

## 2018-06-10 NOTE — ED Notes (Signed)
Called Pt for vitals no answer. 

## 2018-06-10 NOTE — ED Notes (Signed)
Called Pt for vitals. Second call No answer.

## 2018-06-10 NOTE — ED Triage Notes (Signed)
Pt presents with injuries from a fall where she slipped in the bathroom, struck her head on the toilet; bruising and swelling noted the R face, jaw; pain with movement/ talking; pt states she does not remember getting out of the floor; pt also concerned that she is sleepy and nauseated; incident occurred 3 hrs ago

## 2018-06-11 NOTE — ED Notes (Signed)
Follow up call made  No answer  06/11/18  1228 s Terrilyn Tyner rn

## 2018-06-30 ENCOUNTER — Encounter (HOSPITAL_COMMUNITY): Payer: Self-pay | Admitting: Nurse Practitioner

## 2018-06-30 ENCOUNTER — Emergency Department (HOSPITAL_COMMUNITY)
Admission: EM | Admit: 2018-06-30 | Discharge: 2018-07-01 | Disposition: A | Discharge status: Home / Self Care | Payer: Medicaid Other | Attending: Emergency Medicine | Admitting: Emergency Medicine

## 2018-06-30 DIAGNOSIS — F4321 Adjustment disorder with depressed mood: Secondary | ICD-10-CM | POA: Diagnosis not present

## 2018-06-30 DIAGNOSIS — R45851 Suicidal ideations: Secondary | ICD-10-CM | POA: Insufficient documentation

## 2018-06-30 DIAGNOSIS — F17210 Nicotine dependence, cigarettes, uncomplicated: Secondary | ICD-10-CM | POA: Insufficient documentation

## 2018-06-30 DIAGNOSIS — F4325 Adjustment disorder with mixed disturbance of emotions and conduct: Secondary | ICD-10-CM | POA: Diagnosis present

## 2018-06-30 DIAGNOSIS — F329 Major depressive disorder, single episode, unspecified: Secondary | ICD-10-CM | POA: Diagnosis not present

## 2018-06-30 DIAGNOSIS — Z79899 Other long term (current) drug therapy: Secondary | ICD-10-CM | POA: Insufficient documentation

## 2018-06-30 LAB — COMPREHENSIVE METABOLIC PANEL
ALBUMIN: 4.1 g/dL (ref 3.5–5.0)
ALT: 9 U/L (ref 0–44)
AST: 13 U/L — AB (ref 15–41)
Alkaline Phosphatase: 58 U/L (ref 38–126)
Anion gap: 10 (ref 5–15)
BILIRUBIN TOTAL: 0.6 mg/dL (ref 0.3–1.2)
BUN: 12 mg/dL (ref 6–20)
CO2: 25 mmol/L (ref 22–32)
Calcium: 9.4 mg/dL (ref 8.9–10.3)
Chloride: 110 mmol/L (ref 98–111)
Creatinine, Ser: 0.9 mg/dL (ref 0.44–1.00)
GFR calc Af Amer: >60 mL/min (ref >60)
GFR calc non Af Amer: >60 mL/min (ref >60)
GLUCOSE: 90 mg/dL (ref 70–99)
POTASSIUM: 4.1 mmol/L (ref 3.5–5.1)
Sodium: 145 mmol/L (ref 135–145)
TOTAL PROTEIN: 7.2 g/dL (ref 6.5–8.1)

## 2018-06-30 LAB — CBC
HEMATOCRIT: 41.5 % (ref 36.0–46.0)
HEMOGLOBIN: 14.3 g/dL (ref 12.0–15.0)
MCH: 29.9 pg (ref 26.0–34.0)
MCHC: 34.5 g/dL (ref 30.0–36.0)
MCV: 86.8 fL (ref 78.0–100.0)
Platelets: 331 10*3/uL (ref 150–400)
RBC: 4.78 MIL/uL (ref 3.87–5.11)
RDW: 13.4 % (ref 11.5–15.5)
WBC: 8 10*3/uL (ref 4.0–10.5)

## 2018-06-30 LAB — RAPID URINE DRUG SCREEN, HOSP PERFORMED
AMPHETAMINES: NOT DETECTED
BARBITURATES: NOT DETECTED
BENZODIAZEPINES: NOT DETECTED
Cocaine: NOT DETECTED
Opiates: NOT DETECTED
TETRAHYDROCANNABINOL: NOT DETECTED

## 2018-06-30 LAB — PREGNANCY, URINE: PREG TEST UR: NEGATIVE

## 2018-06-30 LAB — ACETAMINOPHEN LEVEL: Acetaminophen (Tylenol), Serum: <10 ug/mL — ABNORMAL LOW (ref 10–30)

## 2018-06-30 LAB — SALICYLATE LEVEL: Salicylate Lvl: <7 mg/dL (ref 2.8–30.0)

## 2018-06-30 LAB — ETHANOL: Alcohol, Ethyl (B): <10 mg/dL (ref <10)
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
## 2018-06-30 NOTE — ED Notes (Signed)
Patient completed her tele psych assessment. Patient remains calm and cooperative with care. No distress noted.

## 2018-06-30 NOTE — ED Triage Notes (Signed)
Pt states she is under severe stress and depression, for the last couple of months marital and family problems. She states that she is to a point where "everyone will be better with me dead." Family member called me to the side and confided that patient talked about obtaining a gun, pt collaborates these. She reports a prior psychiatric hx and was prescribed Latuda but has not taken it for years.
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
## 2018-06-30 NOTE — [facility] Counselor (Signed)
Pt reported, she does not want her husband Tobey Grim(Zachary Parry) to know where she is. Discussed with Marcelino DusterMichelle, RN.   Redmond Pullingreylese D Alexa Golebiewski, MS, [facility], [facility] Triage Specialist [phone]

## 2018-07-01 DIAGNOSIS — F4321 Adjustment disorder with depressed mood: Secondary | ICD-10-CM | POA: Diagnosis present

## 2018-07-01 DIAGNOSIS — F4325 Adjustment disorder with mixed disturbance of emotions and conduct: Secondary | ICD-10-CM | POA: Diagnosis present

## 2018-07-01 MED ORDER — NICOTINE 21 MG/24HR TD PT24
21.0000 mg | MEDICATED_PATCH | Freq: Every day | TRANSDERMAL | Status: DC
  Administered 2018-07-01: 21 mg via TRANSDERMAL
  Filled 2018-07-01: qty 1
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
## 2018-07-01 NOTE — Discharge Instructions (Signed)
For your behavioral health needs you are advised to follow up with [facility].  They offer both psychiatry/medication management and therapy.  New patients are seen at their walk-in clinic.  Walk-in hours are Monday - Friday from 8:00 am - 12:00 pm, and from 1:00 pm - 3:00 pm.  Walk-in patients are seen on a first come, first served basis, so try to arrive as early as possible for the best chance of being seen the same day.  There is an initial fee of $22.50:       [facility]      [address]      [place], [place] [id number]      [phone]

## 2018-07-01 NOTE — BH Assessment (Addendum)
[facility]  Per Juanetta BeetsJacqueline Norman, DO, this pt does not require psychiatric hospitalization at this time.  [facility] for [facility] and therapy.  This has been included in pt's discharge instructions.  Pt's nurse, Kendal Hymendie, has been notified.  Doylene Canninghomas Rochel Privett, MA Triage Specialist [phone]

## 2018-07-01 NOTE — ED Notes (Signed)
Pt discharged safely with mother.  Pt was calm and cooperative, and contracted for safety.  All belongings were returned to patient. Discharge instructions were reviewed.
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
## 2020-06-20 ENCOUNTER — Encounter (HOSPITAL_COMMUNITY): Payer: Self-pay | Admitting: *Deleted

## 2020-06-20 ENCOUNTER — Other Ambulatory Visit: Payer: Self-pay

## 2020-06-20 ENCOUNTER — Emergency Department (HOSPITAL_COMMUNITY)
Admission: EM | Admit: 2020-06-20 | Discharge: 2020-06-21 | Disposition: A | Discharge status: Home / Self Care | Payer: Medicaid Other | Attending: Emergency Medicine | Admitting: Emergency Medicine

## 2020-06-20 DIAGNOSIS — R109 Unspecified abdominal pain: Secondary | ICD-10-CM | POA: Diagnosis present

## 2020-06-20 DIAGNOSIS — R112 Nausea with vomiting, unspecified: Secondary | ICD-10-CM | POA: Insufficient documentation

## 2020-06-20 DIAGNOSIS — K921 Melena: Secondary | ICD-10-CM | POA: Diagnosis not present

## 2020-06-20 DIAGNOSIS — Z5321 Procedure and treatment not carried out due to patient leaving prior to being seen by health care provider: Secondary | ICD-10-CM | POA: Diagnosis not present

## 2020-06-20 DIAGNOSIS — R197 Diarrhea, unspecified: Secondary | ICD-10-CM | POA: Insufficient documentation

## 2020-06-20 LAB — CBC
HCT: 46.7 % — ABNORMAL HIGH (ref 36.0–46.0)
Hemoglobin: 15.5 g/dL — ABNORMAL HIGH (ref 12.0–15.0)
MCH: 29.1 pg (ref 26.0–34.0)
MCHC: 33.2 g/dL (ref 30.0–36.0)
MCV: 87.8 fL (ref 80.0–100.0)
Platelets: 387 10*3/uL (ref 150–400)
RBC: 5.32 MIL/uL — ABNORMAL HIGH (ref 3.87–5.11)
RDW: 13.3 % (ref 11.5–15.5)
WBC: 18.9 10*3/uL — ABNORMAL HIGH (ref 4.0–10.5)
nRBC: 0 % (ref 0.0–0.2)

## 2020-06-20 LAB — I-STAT BETA HCG BLOOD, ED (MC, WL, AP ONLY): I-stat hCG, quantitative: <5 m[IU]/mL (ref <5)

## 2020-06-20 MED ORDER — SODIUM CHLORIDE 0.9% FLUSH: 3.0000 mL | Freq: Once | INTRAVENOUS | Status: DC

## 2020-06-20 NOTE — ED Triage Notes (Signed)
The pt is c/o  abd with nausea  Vomiting and diarrhea that started 3 hours ago  lmp  A few days ago

## 2020-06-21 LAB — URINALYSIS, ROUTINE W REFLEX MICROSCOPIC
Bacteria, UA: NONE SEEN
Bilirubin Urine: NEGATIVE
Glucose, UA: NEGATIVE mg/dL
Ketones, ur: NEGATIVE mg/dL
Leukocytes,Ua: NEGATIVE
Nitrite: NEGATIVE
Protein, ur: NEGATIVE mg/dL
Specific Gravity, Urine: 1.019 (ref 1.005–1.030)
pH: 5 (ref 5.0–8.0)

## 2020-06-21 LAB — LIPASE, BLOOD: Lipase: 31 U/L (ref 11–51)

## 2020-06-21 LAB — COMPREHENSIVE METABOLIC PANEL
ALT: 19 U/L (ref 0–44)
AST: 19 U/L (ref 15–41)
Albumin: 4.2 g/dL (ref 3.5–5.0)
Alkaline Phosphatase: 68 U/L (ref 38–126)
Anion gap: 10 (ref 5–15)
BUN: 11 mg/dL (ref 6–20)
CO2: 26 mmol/L (ref 22–32)
Calcium: 9.5 mg/dL (ref 8.9–10.3)
Chloride: 103 mmol/L (ref 98–111)
Creatinine, Ser: 1.12 mg/dL — ABNORMAL HIGH (ref 0.44–1.00)
GFR calc Af Amer: >60 mL/min (ref >60)
GFR calc non Af Amer: >60 mL/min (ref >60)
Glucose, Bld: 102 mg/dL — ABNORMAL HIGH (ref 70–99)
Potassium: 3.9 mmol/L (ref 3.5–5.1)
Sodium: 139 mmol/L (ref 135–145)
Total Bilirubin: 0.5 mg/dL (ref 0.3–1.2)
Total Protein: 7.2 g/dL (ref 6.5–8.1)

## 2020-06-21 NOTE — ED Notes (Signed)
Pt noted to have blood in her stool, sort nurse aware.

## 2020-06-21 NOTE — ED Notes (Signed)
Pt states she is leaving
